# Patient Record
Sex: Female | Born: 1965 | Race: White | Hispanic: No
Health system: Midwestern US, Community
[De-identification: ages and names within clinical notes are randomized; demographics above are authoritative.]

## PROBLEM LIST (undated history)

## (undated) ENCOUNTER — Encounter (HOSPITAL_COMMUNITY): Admission: RE | Payer: Self-pay | Source: Ambulatory Visit

## (undated) SURGERY — ARTHROSCOPY SHOULDER
Anesthesia: General | Site: Shoulder | Laterality: Left

---

## 2004-07-12 ENCOUNTER — Ambulatory Visit (HOSPITAL_COMMUNITY): Payer: Self-pay | Admitting: Family Medicine

## 2010-09-23 LAB — BASIC METABOLIC PANEL
BUN: 25 mg/dL — ABNORMAL HIGH (ref 6–20)
CO2: 30 mmol/L (ref 20–31)
Calcium: 9.7 mg/dL (ref 8.6–10.3)
Chloride: 112 mmol/L — ABNORMAL HIGH (ref 98–110)
Creatinine: 0.63 mg/dL (ref 0.2–1.7)
GFR African American: >60 mL/min (ref >60)
GFR Non-African American: >60 mL/min (ref >60)
Glucose: 115 mg/dL — ABNORMAL HIGH (ref 74–100)
Potassium: 4.2 mmol/L (ref 3.5–5.1)
Sodium: 142 mmol/L (ref 136–146)

## 2010-09-23 LAB — CBC WITH DIFFERENTIAL
Absolute Baso #: 0 10*3/uL (ref 0.0–0.2)
Absolute Eos #: 0 10*3/uL (ref 0.0–0.4)
Absolute Lymph #: 2.5 10*3/uL (ref 1.0–4.8)
Absolute Mono #: 0.7 10*3/uL (ref 0.0–1.0)
Absolute Neut #: 7.8 10*3/uL — ABNORMAL HIGH (ref 1.8–7.7)
Basophils: 0 % (ref 0–2)
Eosinophils %: 0 % (ref 0–8)
Hematocrit: 37.8 % (ref 36–46)
Hemoglobin: 12.9 g/dL (ref 12.0–16.0)
Lymphocytes: 23 % — ABNORMAL LOW (ref 24–44)
MCH: 31.6 pg (ref 26–34)
MCHC: 34.3 g/dL (ref 31–37)
MCV: 92.4 fL (ref 80–100)
Monocytes: 6 % (ref 0–12)
Platelet Count: 238 10*3/uL (ref 140–450)
RBC: 4.09 m/uL (ref 4.0–5.2)
RDW: 13.9 % (ref 10.0–14.0)
Seg Neutrophils: 71 % — ABNORMAL HIGH (ref 36–66)
WBC: 11 10*3/uL (ref 3.5–11.0)

## 2010-09-23 LAB — PROTIME-INR
INR: 0.9 (ref 0.9–1.2)
Prothrombin Time: 10.1 s (ref 9.7–12.2)

## 2010-09-23 LAB — APTT: PTT: 30.3 s (ref 23.2–34.4)

## 2013-07-11 NOTE — Discharge Instructions (Signed)
Back Strain: After Your Visit  Your Care Instructions     Back strain happens when you overstretch, or pull, a muscle in your back. You may hurt your back in an accident or when you exercise or lift something.  Most back pain will get better with rest and time. You can take care of yourself at home to help your back heal.  Follow-up care is a key part of your treatment and safety. Be sure to make and go to all appointments, and call your doctor if you are having problems. It's also a good idea to know your test results and keep a list of the medicines you take.  How can you care for yourself at home?   Try to stay as active as you can, but stop or reduce any activity that causes pain.   Put ice or a cold pack on the sore muscle for 10 to 20 minutes at a time to stop swelling. Try this every 1 to 2 hours for 3 days (when you are awake) or until the swelling goes down. Put a thin cloth between the ice pack and your skin.   After 2 or 3 days, apply a heating pad on low or a warm cloth to your back. Some doctors suggest that you go back and forth between hot and cold treatments.   Take pain medicines exactly as directed.   If the doctor gave you a prescription medicine for pain, take it as prescribed.   If you are not taking a prescription pain medicine, ask your doctor if you can take an over-the-counter medicine.   Try sleeping on your side with a pillow between your legs. Or put a pillow under your knees when you lie on your back. These measures can ease pain in your lower back.   Return to your usual level of activity slowly.  When should you call for help?  Call 911 anytime you think you may need emergency care. For example, call if:   You suddenly cannot walk or stand.   You have sudden numbness or weakness in both legs.  Call your doctor now or seek immediate medical care if:   You have a new loss of bowel or bladder control.   Your pain is worse.   You have new pain, numbness, tingling, or  weakness, especially in the buttocks, genital or rectal area, legs, or feet.   You have symptoms of a urinary infection. For example:   You have blood or pus in your urine.   You have pain in your back just below your rib cage. This is called flank pain.   You have a fever, chills, or body aches.   It hurts to urinate.   You have groin or belly pain.  Watch closely for changes in your health, and be sure to contact your doctor if:   Your pain and swelling do not start to get better after 2 days of home treatment.   Where can you learn more?   Go to [email or web address] and sign in to your MyChart account. Enter U095 in the Search Health Information box to learn more about "Back Strain: After Your Visit."    If you do not have an account, please click on the "Sign Up Now" link.      2006-2015 Healthwise, Incorporated. Care instructions adapted under license by [facility]. This care instruction is for use with your licensed healthcare professional. If you have questions about a medical condition or this   instruction, always ask your healthcare professional. [place] any warranty or liability for your use of this information.  Content Version: 10.4.390249; Current as of: March 18, 2013                Muscle Strain: After Your Visit  Your Care Instructions  A muscle strain happens when you overstretch, or pull, a muscle. It can happen when you exercise or lift something or when you have an accident. Rest and other home care can help the muscle heal.  Follow-up care is a key part of your treatment and safety. Be sure to make and go to all appointments, and call your doctor if you are having problems. It's also a good idea to know your test results and keep a list of the medicines you take.  How can you care for yourself at home?   Rest the strained muscle. Do not put weight on it for a day or two. If your doctor advises you to, use crutches or a sling to rest a  sore limb.   Put ice or a cold pack on the sore muscle for 10 to 20 minutes at a time to stop swelling. Put a thin cloth between the ice pack and your skin.   Prop up the sore arm or leg on a pillow when you ice it or anytime you sit or lie down during the next 3 days. Try to keep it above the level of your heart. This will help reduce swelling.   Take pain medicines exactly as directed.   If the doctor gave you a prescription medicine for pain, take it as prescribed.   If you are not taking a prescription pain medicine, ask your doctor if you can take an over-the-counter medicine.   Do not do anything that makes the pain worse. Return to exercise gradually as you feel better.  When should you call for help?  Call your doctor now or seek immediate medical care if:   You have new severe pain.   Your injured limb is cool or pale or changes color.   You have tingling, weakness, or numbness in your injured limb.   You cannot move the injured area.  Watch closely for changes in your health, and be sure to contact your doctor if:   You cannot put weight on a joint, or it feels unsteady when you walk.   Pain and swelling get worse or do not start to get better after 2 days of home treatment.   Where can you learn more?   Go to [email or web address] and sign in to your MyChart account. Enter [id number] in the [place] box to learn more about "Muscle Strain: After Your Visit."    If you do not have an account, please click on the "Sign Up Now" link.      2006-2015 Healthwise, Incorporated. Care instructions adapted under license by [facility]. This care instruction is for use with your licensed healthcare professional. If you have questions about a medical condition or this instruction, always ask your healthcare professional. [place] any warranty or liability for your use of this information.  Content Version: 10.4.390249; Current as of: March 18, 2013

## 2013-07-11 NOTE — ED Provider Notes (Signed)
Patient is a 48 y.o. female presenting with back pain. The history is provided by the patient.   Back Pain  Location:  Lumbar spine  Quality:  Aching and stiffness  Stiffness is present:  All day  Radiates to:  Does not radiate  Pain severity:  Moderate  Pain is:  Same all the time  Onset quality:  Gradual  Timing:  Intermittent  Progression:  Unchanged  Chronicity:  New  Context: falling and recent injury    Context: not jumping from heights, not MCA, not MVA, not occupational injury and not pedestrian accident    Context comment:  FELL ON ICE  Relieved by:  Being still  Worsened by:  Ambulation, bending, twisting and touching  Ineffective treatments:  NSAIDs  Associated symptoms: no abdominal pain, no abdominal swelling, no bladder incontinence, no bowel incontinence, no chest pain, no dysuria, no fever, no headaches, no leg pain, no numbness, no paresthesias, no pelvic pain, no perianal numbness, no tingling, no weakness and no weight loss    Risk factors comment:  DISC SURGERY 2000      Review of Systems   Constitutional: Negative for fever, chills, weight loss and fatigue.   Respiratory: Negative for cough and shortness of breath.    Cardiovascular: Negative for chest pain.   Gastrointestinal: Negative for nausea, vomiting, abdominal pain and bowel incontinence.   Genitourinary: Negative for bladder incontinence, dysuria, difficulty urinating and pelvic pain.   Musculoskeletal: Positive for back pain. Negative for gait problem.   Skin: Negative for wound.   Neurological: Negative for dizziness, tingling, syncope, weakness, numbness, headaches and paresthesias.       Physical Exam    Procedures    MDM  Number of Diagnoses or Management Options  Sprain of lumbar region:   Urinary tract infection, site not specified:   Diagnosis management comments: [facility] REPORT REVEALS 30 OXYCODONE PRESCRIBED ON 07/07/2013, PATIENT DID NOT REVEAL UNTIL QUESTIONED ABOUT RECENT RX. STATES "NOT WORKING", ADVISED TO FOLLOW UP WITH  PCP AND WILL PRESCRIBE NSAID        Amount and/or Complexity of Data Reviewed  Clinical lab tests: reviewed and ordered  Tests in the radiology section of CPT??: reviewed and ordered    Risk of Complications, Morbidity, and/or Mortality  Presenting problems: low  Diagnostic procedures: low  Management options: low    Patient Progress  Patient progress: stable      Labs  Labs Reviewed   UA W/REFLEX CULTURE - Abnormal; Notable for the following:     Bilirubin Urine SMALL (*)     Ketones, Urine TRACE (*)     Specific Gravity, UA >1.030 (*)     Protein, UA 1+ (*)     Nitrite, Urine POSITIVE (*)     All other components within normal limits   URINE DRUG SCREEN - Abnormal; Notable for the following:     Opiates, Urine POSITIVE (*)     Cannabinoid Scrn, Ur POSITIVE (*)     Oxycodone Screen, Ur POSITIVE (*)     Methamphetamine, Urine POSITIVE (*)     All other components within normal limits   MICROSCOPIC URINALYSIS - Abnormal; Notable for the following:     Bacteria, UA 2+ (*)     Mucus, UA 3+ (*)     All other components within normal limits   URINE CULTURE         Radiology  Xr Lumbar Spine Standard    07/12/2013   FINAL REPORT  EXAM:  XR LUMBAR SPINE STANDARD  HISTORY:  fall/pain   TECHNIQUE:  Three views of the lumbar spine were obtained.  PRIORS:  No prior studies for comparison.  FINDINGS:   Vertebral body height is normal, no acute fracture. Mild spondylitic changes are noted, there is grade 1 anterolisthesis of L4 on L5, and L5 on S1.  Postsurgical changes in the abdomen. Soft tissues are unremarkable.      Impression:   No acute fracture of the lumbar spine.   Anterolisthesis of L4 on L5 and L5 on S1 appear to be chronic changes, if available comparison with old studies may be helpful. If symptoms persist an MRI examination may be performed.   Electronically Signed By: Leighton ParodyJEHANZEB KHAN       EKG Interpretation.      Summation      Patient Course:        ED Medications administered this visit:    Medications    sulfamethoxazole-trimethoprim (BACTRIM DS) 800-160 MG per tablet 1 tablet (not administered)   cyclobenzaprine (FLEXERIL) tablet 10 mg (not administered)       New Prescriptions from this visit:    New Prescriptions    CYCLOBENZAPRINE (FLEXERIL) 10 MG TABLET    Take 1 tablet by mouth 3 times daily as needed for Muscle spasms for up to 3 days.    DICLOFENAC (VOLTAREN) 75 MG EC TABLET    Take 1 tablet by mouth 2 times daily (with meals).    SULFAMETHOXAZOLE-TRIMETHOPRIM (BACTRIM DS) 800-160 MG PER TABLET    Take 1 tablet by mouth 2 times daily for 5 days.       Follow-up:  Lysbeth PennerMarc Anthony Naderer  [address]  [place] [id number]  [phone]    Call in 2 days  SOONER, If symptoms worsen        Final Impression:   1. Sprain of lumbar region    2. Urinary tract infection, site not specified               (Please note that portions of this note were completed with a voice recognition program.  Efforts were made to edit the dictations but occasionally words are mis-transcribed.)      Rosemarie AxBrett W Juanisha Bautch, CNP  07/11/13 2219

## 2013-07-12 ENCOUNTER — Inpatient Hospital Stay: Admit: 2013-07-12 | Discharge: 2013-07-12 | Disposition: A | Discharge status: Home / Self Care

## 2013-07-12 LAB — MICROSCOPIC URINALYSIS
Epithelial Cells UA: 10 /HPF (ref 0–25)
RBC, UA: 0 /HPF (ref 0–2)
WBC, UA: 2 /HPF (ref 0–5)

## 2013-07-12 LAB — URINE DRUG SCREEN
Amphetamine Screen, Ur: NEGATIVE
Barbiturate Screen, Ur: NEGATIVE
Benzodiazepine Screen, Urine: NEGATIVE
Buprenorphine Urine: NEGATIVE
Cannabinoid Scrn, Ur: POSITIVE — AB
Cocaine Metabolite, Urine: NEGATIVE
Methadone Screen, Urine: NEGATIVE
Methamphetamine, Urine: POSITIVE — AB
Opiates, Urine: POSITIVE — AB
Oxycodone Screen, Ur: POSITIVE — AB
Phencyclidine, Urine: NEGATIVE
Propoxyphene, Urine: NEGATIVE
Tricyclic Antidepressants, Urine: NEGATIVE

## 2013-07-12 LAB — UA W/REFLEX CULTURE
Glucose, Ur: NEGATIVE
Leukocyte Esterase, Urine: NEGATIVE
Nitrite, Urine: POSITIVE — AB
Specific Gravity, UA: >1.03 — ABNORMAL HIGH (ref 1.010–1.020)
Urine Hgb: NEGATIVE
Urobilinogen, Urine: NORMAL
pH, UA: 5.5 (ref 5.0–9.0)

## 2013-07-12 MED ORDER — DICLOFENAC SODIUM 75 MG PO TBEC
75 MG | ORAL_TABLET | Freq: Two times a day (BID) | ORAL | Status: DC
Start: 2013-07-12 — End: 2013-11-29

## 2013-07-12 MED ORDER — CYCLOBENZAPRINE HCL 10 MG PO TABS
10 MG | ORAL_TABLET | Freq: Three times a day (TID) | ORAL | Status: AC | PRN
Start: 2013-07-12 — End: 2013-07-14

## 2013-07-12 MED ORDER — SULFAMETHOXAZOLE-TRIMETHOPRIM 800-160 MG PO TABS
800-160 MG | ORAL_TABLET | Freq: Two times a day (BID) | ORAL | Status: AC
Start: 2013-07-12 — End: 2013-07-16

## 2013-07-12 MED ADMIN — sulfamethoxazole-trimethoprim (BACTRIM DS) 800-160 MG per tablet 1 tablet: 1 | ORAL | @ 02:00:00 | NDC 68084023011

## 2013-07-12 MED ADMIN — cyclobenzaprine (FLEXERIL) tablet 10 mg: 10 mg | ORAL | @ 02:00:00 | NDC 68084039711

## 2013-07-12 MED FILL — SULFAMETHOXAZOLE-TMP DS 800-160 MG PO TABS: 800-160 MG | ORAL | Qty: 1

## 2013-07-12 MED FILL — CYCLOBENZAPRINE HCL 10 MG PO TABS: 10 MG | ORAL | Qty: 1

## 2013-07-14 LAB — CULTURE, URINE

## 2013-11-29 ENCOUNTER — Inpatient Hospital Stay: Admit: 2013-11-29 | Discharge: 2013-11-30 | Disposition: A | Discharge status: Home / Self Care

## 2013-11-29 MED ORDER — HYDROCODONE-ACETAMINOPHEN 5-325 MG PO TABS
5-325 MG | ORAL_TABLET | Freq: Four times a day (QID) | ORAL | Status: AC | PRN
Start: 2013-11-29 — End: 2013-12-02

## 2013-11-29 MED ORDER — CLINDAMYCIN HCL 300 MG PO CAPS
300 MG | ORAL_CAPSULE | Freq: Four times a day (QID) | ORAL | Status: AC
Start: 2013-11-29 — End: 2013-12-09

## 2013-11-29 MED FILL — CLEOCIN IN D5W 600 MG/50ML IV SOLN: 600 MG/50ML | INTRAVENOUS | Qty: 50

## 2013-11-29 NOTE — Discharge Instructions (Signed)
Cellulitis: After Your Visit  Your Care Instructions     Cellulitis is a skin infection. It often occurs after a break in the skin from a scrape, cut, bite, or puncture, or after a rash.  The doctor has checked you carefully, but problems can develop later. If you notice any problems or new symptoms, get medical treatment right away.  Follow-up care is a key part of your treatment and safety. Be sure to make and go to all appointments, and call your doctor if you are having problems. It's also a good idea to know your test results and keep a list of the medicines you take.  How can you care for yourself at home?   Take your antibiotics as directed. Do not stop taking them just because you feel better. You need to take the full course of antibiotics.   Prop up the infected area on pillows to reduce pain and swelling. Try to keep the area above the level of your heart as often as you can.   If your doctor told you how to care for your wound, follow your doctor's instructions. If you did not get instructions, follow this general advice:   Wash the wound with clean water 2 times a day. Don't use hydrogen peroxide or alcohol, which can slow healing.   You may cover the wound with a thin layer of petroleum jelly, such as Vaseline, and a nonstick bandage.   Apply more petroleum jelly and replace the bandage as needed.   Be safe with medicines. Take pain medicines exactly as directed.   If the doctor gave you a prescription medicine for pain, take it as prescribed.   If you are not taking a prescription pain medicine, ask your doctor if you can take an over-the-counter medicine.  To prevent cellulitis in the future   Try to prevent cuts, scrapes, or other injuries to your skin. Cellulitis most often occurs where there is a break in the skin.   If you get a scrape, cut, mild burn, or bite, wash the wound with clean water as soon as you can to help avoid infection. Don't use hydrogen peroxide or alcohol, which  can slow healing.   If you have swelling in your legs (edema), support stockings and good skin care may help prevent leg sores and cellulitis.   Take care of your feet, especially if you have diabetes or other conditions that increase the risk of infection. Wear shoes and socks. Do not go barefoot. If you have athlete's foot or other skin problems on your feet, talk to your doctor about how to treat them.  When should you call for help?  Call your doctor now or seek immediate medical care if:   You have signs that your infection is getting worse, such as:   Increased pain, swelling, warmth, or redness.   Red streaks leading from the area.   Pus draining from the area.   A fever.   You get a rash.  Watch closely for changes in your health, and be sure to contact your doctor if:   You are not getting better after 1 day (24 hours).   You do not get better as expected.   Where can you learn more?   Go to [email or web address] and sign in to your MyChart account. Enter X309 in the Search Health Information box to learn more about "Cellulitis: After Your Visit."    If you do not have an account, please click on the "  Sign Up Now" link.      2006-2015 Healthwise, Incorporated. Care instructions adapted under license by [facility]. This care instruction is for use with your licensed healthcare professional. If you have questions about a medical condition or this instruction, always ask your healthcare professional. Healthwise, Incorporated disclaims any warranty or liability for your use of this information.  Content Version: 10.5.422740; Current as of: June 24, 2013

## 2013-11-29 NOTE — ED Provider Notes (Signed)
Patient is a 48 y.o. female presenting with facial injury. The history is provided by the patient.   Facial Injury  Injury mechanism: SWELLING/INSCECT STING.  Location:  Face  Time since incident:  4 days  Pain details:     Quality:  Pressure and aching    Severity:  Moderate    Timing:  Intermittent    Progression:  Waxing and waning  Chronicity:  New  Foreign body present:  No foreign bodies  Relieved by:  None tried  Worsened by:  Nothing tried  Ineffective treatments:  None tried  Associated symptoms: no altered mental status, no congestion, no difficulty breathing, no double vision, no ear pain, no epistaxis, no headaches, no loss of consciousness, no malocclusion, no nausea, no neck pain, no rhinorrhea, no trismus, no vomiting and no wheezing    Risk factors: no concern for non-accidental trauma and no trauma        Review of Systems   HENT: Negative for congestion, ear pain, nosebleeds and rhinorrhea.    Eyes: Negative for double vision.   Respiratory: Negative for wheezing.    Gastrointestinal: Negative for nausea and vomiting.   Musculoskeletal: Negative for neck pain.   Neurological: Negative for loss of consciousness and headaches.       Physical Exam   Constitutional: She is oriented to person, place, and time. She appears well-developed and well-nourished. No distress.   HENT:   Head: Head is without right periorbital erythema and without left periorbital erythema.       Nose: Nose normal.   Mouth/Throat: Mucous membranes are normal. Mucous membranes are not dry. Posterior oropharyngeal erythema (mild) present. No oropharyngeal exudate or posterior oropharyngeal edema.   Eyes: Conjunctivae are normal. No scleral icterus.   Neck: Normal range of motion. Neck supple.   Cardiovascular: Normal rate and regular rhythm.    Pulmonary/Chest: Effort normal and breath sounds normal. No respiratory distress. She has no wheezes.   Abdominal: Soft. Bowel sounds are normal.   Lymphadenopathy:     She has no  cervical adenopathy.   Neurological: She is alert and oriented to person, place, and time.   Skin: Skin is warm and dry.   Nursing note and vitals reviewed.      Procedures    MDM  Number of Diagnoses or Management Options  Facial cellulitis:   Risk of Complications, Morbidity, and/or Mortality  Presenting problems: moderate  Diagnostic procedures: low  Management options: moderate  General comments: Patient is refusing IV antibiotics in ER, she only will agree to oral antibiotic medication. She is advised of risk of worsening infection and difficulty breathing. She is advised to return to ER or call 911 immediately for any further concerns.        Labs      Radiology      EKG Interpretation.      Summation      Patient Course:        ED Medications administered this visit:  Medications - No data to display    New Prescriptions from this visit:    Discharge Medication List as of 11/29/2013  7:47 PM      START taking these medications    Details   clindamycin (CLEOCIN) 300 MG capsule Take 1 capsule by mouth 4 times daily for 10 days, Disp-40 capsule, R-0      HYDROcodone-acetaminophen (NORCO) 5-325 MG per tablet Take 1 tablet by mouth every 6 hours as needed for Pain, Disp-12 tablet, R-0  Follow-up:  Provider of choice    Schedule an appointment as soon as possible for a visit      Return to the ER     In 2 days  Wound check, SOONER, If symptoms worsen        Final Impression:   1. Facial cellulitis               (Please note that portions of this note were completed with a voice recognition program.  Efforts were made to edit the dictations but occasionally words are mis-transcribed.)      [place], CNP  11/29/13 2342

## 2017-06-29 ENCOUNTER — Ambulatory Visit (HOSPITAL_BASED_OUTPATIENT_CLINIC_OR_DEPARTMENT_OTHER): Payer: Self-pay | Admitting: ORTHOPEDIC, SPORTS MEDICINE

## 2017-08-24 ENCOUNTER — Ambulatory Visit (HOSPITAL_BASED_OUTPATIENT_CLINIC_OR_DEPARTMENT_OTHER): Payer: 59 | Admitting: ORTHOPEDIC, SPORTS MEDICINE

## 2017-08-24 ENCOUNTER — Ambulatory Visit
Admission: RE | Admit: 2017-08-24 | Discharge: 2017-08-24 | Disposition: A | Discharge status: Home / Self Care | Payer: 59 | Source: Ambulatory Visit | Attending: ORTHOPEDIC, SPORTS MEDICINE | Admitting: ORTHOPEDIC, SPORTS MEDICINE

## 2017-08-24 ENCOUNTER — Encounter (HOSPITAL_BASED_OUTPATIENT_CLINIC_OR_DEPARTMENT_OTHER): Payer: Self-pay | Admitting: ORTHOPEDIC, SPORTS MEDICINE

## 2017-08-24 VITALS — BP 100/57 | HR 74 | Temp 98.2°F | Ht 64.45 in | Wt 160.3 lb

## 2017-08-24 DIAGNOSIS — M25512 Pain in left shoulder: Secondary | ICD-10-CM

## 2017-08-24 DIAGNOSIS — M75102 Unspecified rotator cuff tear or rupture of left shoulder, not specified as traumatic: Secondary | ICD-10-CM

## 2017-08-24 NOTE — Patient Instructions (Signed)
After rotator cuff repair:  You will be in a brace for 6 weeks after surgery. You will start PT about 4 days after surgery. That can be done at a facility close to home.

## 2017-08-24 NOTE — H&P (Signed)
PATIENT NAMCarren Robertson: Soderlund, [name]   HOSPITAL NUMBER:  [id number]  DATE OF SERVICE: 08/24/2017  DATE OF BIRTH:  [birth year]    HISTORY AND PHYSICAL    HISTORY OF PRESENT ILLNESS:  Natalie Robertson is a 52 year old right-hand dominant female who presented to clinic today for evaluation of her left shoulder pain.  She does not recall a specific injury.  She reports about 5 years ago she fell, but she is not really sure if this is the cause of her shoulder pain.  She has been having pain for about 5 years.  A lot of the pain is located around the anterior lateral side of her shoulder.  She reports it is intermittent.  Her main complaint is it is causing difficulty sleeping.  She rates the pain sometimes up to 7/10.  She will describe it as an ache, but will become stabbing at times.  She denies any numbness or tingling distally.  She denies any radiation of the pain.  She had an injection performed by Dr. Daneil Dolinuel a couple months ago.  She reports initially she thought it made her pain worse, but then she noticed a decrease in her pain.  She has done a home exercise program, but no formalized physical therapy.  She has not had any previous surgeries on the shoulder.    PAST MEDICAL HISTORY:  Negative.    PAST SURGICAL HISTORY:  Left knee ACL reconstruction, cesarean section x2, hammertoe.  No anesthesia complications.    CURRENT MEDICATIONS:  1. Testosterone 50 mg subcutaneously weekly.   2. Estradiol 0.5 mg daily.   3. Progesterone 200 mg daily.    ALLERGIES:  She is allergic to BEE STINGS.    SOCIAL HISTORY:  She is a [employer] teacher.  She denies any use of tobacco.  Alcohol use is occasional.  She is married.    FAMILY HISTORY:  Asthma, COPD, heart attack, acid reflux, and vitamin D deficiency.    REVIEW OF SYSTEMS:  All systems are negative except for the HPI.    PHYSICAL EXAMINATION:  Today, Natalie Robertson is a pleasant female in no acute distress.  She had appropriate mood and affect.  Blood pressure was 100/57, pulse 74,  temperature 36.8 degrees Celsius.  She is 72.2 kg and 163.7 cm.  Well appearing.  The sclerae was nonicteric.  Breathing was not labored.  Abdomen was not distended.  On evaluation of the left upper extremity, forward flexion to 180 degrees, abduction to 90 degrees, external rotation to 50 degrees, internal rotation to L3.  Tender to palpation on the anterior lateral side of her shoulder.  No AC joint tenderness.  No posterior shoulder tenderness.  Rotator cuff strength 5/5 in external rotation with associated pain, 5/5 internal rotation.  Neurovascularly intact distally.  No skin rashes over the upper extremity.    IMAGING:  Radiographs were taken today of the shoulder, which are pretty much negative.  MRI reviewed that was performed at [facility] demonstrates an anterior small full-thickness rotator cuff tear.    ASSESSMENT:  Left shoulder rotator cuff tear.    PLAN:  We spoke with Ms. Maye HidesMcCann concerning treatment options for her left shoulder.  Considering she has been having pain for 5 years and the injection did not help significantly with her symptoms, we would offer her a left shoulder arthroscopy with a rotator cuff repair.  She understands that she will be in a brace for 6 weeks postoperatively.  She is interested in proceeding, but more than likely  would like to do it in June.  So, we will bring her back for history and physical prior to surgery.  She stated an understanding.        Amy Burnard Bunting, PA-C  [facility]    Ernestine Conrad, MD  Assistant Professor   [facility]               CC:   Corey Harold, MD   [address]   [place], [place] [address]     Nanci Pina, MD   [address] [place]   [place]   [address]       DD:  08/24/2017 11:29:48  DT:  08/24/2017 14:18:58 KB  D#:  [id number]  I personally saw and examined the patient. See mid-level's note for additional details. My findings are consistent with Left shoulder pain    Left rotator cuff tear.

## 2017-10-14 ENCOUNTER — Ambulatory Visit (INDEPENDENT_AMBULATORY_CARE_PROVIDER_SITE_OTHER): Payer: 59 | Admitting: Podiatrist

## 2017-10-14 ENCOUNTER — Ambulatory Visit
Admission: RE | Admit: 2017-10-14 | Discharge: 2017-10-14 | Disposition: A | Discharge status: Home / Self Care | Payer: 59 | Source: Ambulatory Visit | Attending: Podiatrist | Admitting: Podiatrist

## 2017-10-14 ENCOUNTER — Encounter (INDEPENDENT_AMBULATORY_CARE_PROVIDER_SITE_OTHER): Payer: Self-pay | Admitting: Podiatrist

## 2017-10-14 VITALS — BP 109/66 | HR 69 | Ht 63.5 in | Wt 156.3 lb

## 2017-10-14 DIAGNOSIS — M79671 Pain in right foot: Secondary | ICD-10-CM

## 2017-10-14 DIAGNOSIS — M722 Plantar fascial fibromatosis: Secondary | ICD-10-CM

## 2017-10-14 DIAGNOSIS — Z981 Arthrodesis status: Secondary | ICD-10-CM | POA: Insufficient documentation

## 2017-10-14 DIAGNOSIS — G8929 Other chronic pain: Secondary | ICD-10-CM

## 2017-10-14 DIAGNOSIS — Z9889 Other specified postprocedural states: Secondary | ICD-10-CM

## 2017-10-14 DIAGNOSIS — R234 Changes in skin texture: Secondary | ICD-10-CM | POA: Insufficient documentation

## 2017-10-14 DIAGNOSIS — Q742 Other congenital malformations of lower limb(s), including pelvic girdle: Secondary | ICD-10-CM

## 2017-10-14 DIAGNOSIS — M79674 Pain in right toe(s): Secondary | ICD-10-CM | POA: Insufficient documentation

## 2017-10-14 DIAGNOSIS — M79675 Pain in left toe(s): Secondary | ICD-10-CM | POA: Insufficient documentation

## 2017-10-14 DIAGNOSIS — M79672 Pain in left foot: Secondary | ICD-10-CM | POA: Insufficient documentation

## 2017-10-14 DIAGNOSIS — Z87891 Personal history of nicotine dependence: Secondary | ICD-10-CM | POA: Insufficient documentation

## 2017-10-14 MED ORDER — UREA 20 % TOPICAL CREAM
TOPICAL_CREAM | Freq: Two times a day (BID) | CUTANEOUS | 3 refills | Status: AC
Start: 2017-10-14 — End: 2018-01-12

## 2017-10-14 NOTE — Procedures (Signed)
[facility]  [address]  [place] [id number]  [phone]  [phone]    Patient Name: Natalie Robertson  MRN: [id number]  Date: 10/14/2017    Orthopaedic Faculty: Victorino SparrowKathryn Jaion Lagrange, DPM    Arch Cookies were applied to the bilateral  LE. Instructions were given on the application and care of the dressing.  Instructions were given on proper skin care, signs and symptoms of skin problems and/or infection.  The patient was instructed to call the Orthopaedic Department with any questions or problems.  Emergency numbers were also given.  The patient verbalized understanding of the above.    Person applying the device: Oscar LaAbigayle Yutzy, MA 10/14/2017, 15:24

## 2017-10-22 NOTE — H&P (Signed)
PODIATRY CLINIC  DEPARTMENT OF ORTHOPAEDICS  [facility]    Physician Office Center  [address]  [place], [place] [address]    PATIENT NAME:  Natalie Robertson  DATE OF BIRTH:  1965/09/12  MRN:  [id number]  DATE OF SERVICE:  10/14/2017    HISTORY OF PRESENT ILLNESS:   52 y.o. White female presents for evaluation of chief complaint of  Right foot pain.  Denies any hx of injury to the area of pain.  States that she was previously diagnosed with right plantar fasciitis by Dr. Odis Luster in [place] but is seeking an additional opinion and possible transfer of care.  Patient states she also has a hx of bilateral heel fissures which she has been treating with lotion, bilateral 2nd digit hammertoe repair done as an arthrodesis and patient feels that the toes are now slightly too long and become painful when they hit the end of her shoe.  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  Patient also works as a Lawyer, is married and lives with her spouse and children.     PAST MEDICAL HISTORY: No past medical history on file.  PAST SURGICAL HISTORY: No past surgical history on file.  MEDICATIONS:   Current Outpatient Medications   Medication Sig   . estradiol (ESTRACE) 0.5 mg Oral Tablet Take 0.5 mg by mouth Once a day   . progesterone micronized (PROMETRIUM) 200 mg Oral Capsule Take 200 mg by mouth Every night   . testosterone enanthate 50 mg/0.5 mL Subcutaneous Auto-Injector 50 mg by Subcutaneous route Every 7 days   . Urea 20 % Cream Apply topically Twice daily for 90 days apply to affected area as directed     ALLERGIES: No Known Allergies  FAMILY MEDICAL HISTORY:   Family Medical History:     None        SOCIAL HISTORY:   Social History     Occupational History   . Not on file   Tobacco Use   . Smoking status: Former Games developer   . Smokeless tobacco: Never Used   Substance and Sexual Activity   . Alcohol use: Not on file   . Drug use: Not on file   . Sexual  activity: Not on file       REVIEW OF SYSTEMS:   Denies fevers, chills, nausea, vomiting, and shortness of breath.  All other systems reviewed and are negative, except as reported in HPI.      PHYSICAL EXAM:   General:  Appears stated age.  Well-appearing.    Psychiatric:  Pleasant.  Cooperative.  Alert and oriented x 3.    Respiratory:  Lung expansion symmetric.  No active work of breathing.    Heme/Lymphatic:  No lymphadenopathy.    ENMT:  Sclera clear.  Trachea midline.  Mucous membranes moist.   Vascular:  +2/4 dorsalis pedis and posterior tibial pulses, bilaterally.  Capillary fill time was less than 3 seconds, bilaterally.  Skin temperature was warm at the level of the toes, bilaterally.  There was no edema noted.   Shaved hair growth noted to bilateral tibia.  Skin was of good turgor and texture.    Neurologic:  Gross sensation intact to touch, bilaterally.  No paresthesias noted, bilateral feet.  No areas of numbness noted.  Dermatologic:    Bilateral plantar heels with very superficial skin fissuring and overlying xerotic appearance.    Mild hypertrophic thickening noted to distal tufts of  bilateral 2nd digits. No open wounds or lesions were noted.  No ecchymosis or erythema noted, bilateral feet.  No areas of hyperpigmentation.  Nails are mildly incurvated however grossly normotrophic in appearance and with no signs of onychocryptosis, bilaterally.  No maceration noted to interspaces.    Musculoskeletal: bilateral 2nd digit s/p arthrodesis at distal interphalangeal joint and with slightly elongated length with respect to adjacent digits, no pain with range of motion or with palpation.  Positive pain with right heel squeeze and with palpation to the anteromedial aspect of the calcaneus, no pain with palpation medial band of the plantar fascia.  +5/5 dorsiflexion, plantarflexion, inversion, eversion, intrinsic muscle strength, bilaterally.  Full range of motion, all quadrants, bilaterally.    No pain with  range of motion.   No  crepitus noted.      RADIOGRAPHS:   1.  right foot, date  10/14/2017, personally reviewed and interpreted by myself:     COMPARISON:  None.    FINDINGS:  Joint alignment is within normal limits. No acute fracture  dislocation. No secondary degenerative change or articular erosions. The  plantar arch is maintained. Soft tissues demonstrate no significant  abnormality.    IMPRESSION:  Unremarkable exam.    ASSESSMENT:   1.  Bilateral foot pain; right greater than left likely 2/2 to plantar fasciitis  2.  Right plantar fasciitis  3. Distal 2nd digit distal tuft pain 2/2 to slightly elongated length and hx of arthrodesis  4. Hx of bilateral heel fissures    PLAN:   - Discussed clinical and radiologic findings and answered all questions.    - Discussed conservative tx for plantar fasciitis including icing, stretching, supportive footwear, arch supports, heel lifts and cortisone injection. Information sheet given.  - Patient advised to start daily BID, at minimum, conservative at home care.  - Demonstrated various stretching exercises  - Discussed features of supportive footwear and OTC inserts such as Superfeet, Power Step and Go Comfort  - Would like to proceed with cortisone injection. Risks, benefits and alternatives discussed.  All questions answered. Advised that only offer cortisone injection to the same area every 4 months and would discontinue after a total of 4 injections.  -Bilateral arch cookies made  - Prescription for 20% Carmol cream given to be applied to all areas of dry skin  - RTC prn      Procedure:  A steroid injection was performed on the  right heel using 1mL of 1% Lidocaine plain, 1mL of 0.5% marcaine plain and 1mL of 40 mg/mL of Depo-medrol. The area was then dressed with a band-aid. Patient tolerated the procedure well and related improvement in pain immediately after injection.      Samara Deist//Geneva Barrero Herschel SenegalB[place]ia, DPM  Podiatry, Assistant Professor  Department of Orthopaedics  [facility]  [employer]  Pager [phone]    Victorino SparrowKathryn Chanc Kervin, North DakotaDPM  10/22/2017, 14:34

## 2017-10-22 NOTE — Procedures (Signed)
See H& P.

## 2018-07-12 ENCOUNTER — Other Ambulatory Visit (FREE_STANDING_LABORATORY_FACILITY): Payer: Self-pay | Admitting: OBSTETRICS/GYNECOLOGY

## 2018-07-12 ENCOUNTER — Encounter (FREE_STANDING_LABORATORY_FACILITY)
Admit: 2018-07-12 | Discharge: 2018-07-12 | Disposition: A | Discharge status: Home / Self Care | Payer: Self-pay | Attending: OBSTETRICS/GYNECOLOGY | Admitting: OBSTETRICS/GYNECOLOGY

## 2018-07-12 LAB — ESTRADIOL: ESTRADIOL: 22 pg/mL

## 2018-07-12 LAB — PROGESTERONE: PROGESTERONE: 0.6 ng/mL

## 2018-07-13 LAB — LIGHT GREEN TOP TUBE

## 2019-12-15 ENCOUNTER — Other Ambulatory Visit (FREE_STANDING_LABORATORY_FACILITY): Payer: Self-pay | Admitting: OBSTETRICS/GYNECOLOGY

## 2019-12-15 ENCOUNTER — Encounter (FREE_STANDING_LABORATORY_FACILITY)
Admit: 2019-12-15 | Discharge: 2019-12-15 | Disposition: A | Discharge status: Home / Self Care | Payer: Self-pay | Attending: OBSTETRICS/GYNECOLOGY | Admitting: OBSTETRICS/GYNECOLOGY

## 2019-12-15 LAB — ESTRADIOL: ESTRADIOL: <24 pg/mL

## 2019-12-15 LAB — PROGESTERONE: PROGESTERONE: <0.5 ng/mL

## 2020-03-09 ENCOUNTER — Encounter (FREE_STANDING_LABORATORY_FACILITY): Admit: 2020-03-09 | Discharge: 2020-03-09 | Disposition: A | Discharge status: Home / Self Care | Payer: Self-pay

## 2020-03-09 ENCOUNTER — Other Ambulatory Visit (FREE_STANDING_LABORATORY_FACILITY): Payer: Self-pay | Admitting: Surgery

## 2020-03-09 DIAGNOSIS — Z01810 Encounter for preprocedural cardiovascular examination: Secondary | ICD-10-CM

## 2020-03-09 DIAGNOSIS — R001 Bradycardia, unspecified: Secondary | ICD-10-CM

## 2020-03-10 LAB — COVID-19 ~~LOC~~ MOLECULAR LAB TESTING: SARS-CoV-2: NOT DETECTED

## 2020-03-12 ENCOUNTER — Encounter (FREE_STANDING_LABORATORY_FACILITY): Admit: 2020-03-12 | Discharge: 2020-03-12 | Disposition: A | Discharge status: Home / Self Care | Payer: Self-pay

## 2020-03-12 ENCOUNTER — Other Ambulatory Visit (HOSPITAL_BASED_OUTPATIENT_CLINIC_OR_DEPARTMENT_OTHER): Payer: 59 | Admitting: Surgery

## 2020-03-12 DIAGNOSIS — K8010 Calculus of gallbladder with chronic cholecystitis without obstruction: Secondary | ICD-10-CM

## 2020-03-14 LAB — SURGICAL PATHOLOGY SPECIMEN

## 2020-03-22 ENCOUNTER — Ambulatory Visit
Admission: RE | Admit: 2020-03-22 | Discharge: 2020-03-22 | Disposition: A | Discharge status: Home / Self Care | Payer: 59 | Source: Ambulatory Visit | Attending: ORTHOPEDIC, SPORTS MEDICINE | Admitting: ORTHOPEDIC, SPORTS MEDICINE

## 2020-03-22 ENCOUNTER — Ambulatory Visit (HOSPITAL_BASED_OUTPATIENT_CLINIC_OR_DEPARTMENT_OTHER): Payer: 59 | Admitting: ORTHOPEDIC, SPORTS MEDICINE

## 2020-03-22 ENCOUNTER — Other Ambulatory Visit: Payer: Self-pay

## 2020-03-22 ENCOUNTER — Encounter (HOSPITAL_BASED_OUTPATIENT_CLINIC_OR_DEPARTMENT_OTHER): Payer: Self-pay | Admitting: ORTHOPEDIC, SPORTS MEDICINE

## 2020-03-22 VITALS — BP 110/45 | HR 82 | Temp 96.4°F

## 2020-03-22 DIAGNOSIS — M25512 Pain in left shoulder: Secondary | ICD-10-CM

## 2020-04-04 NOTE — Progress Notes (Signed)
PATIENT NAMESEDONIA, Robertson  HOSPITAL NUMBER: [id number]  DATE OF SERVICE: 03/22/2020  DATE OF BIRTH:  06/28/1965    CLINIC NOTE    SUBJECTIVE:  Natalie Robertson is a 54 year old right-hand-dominant female who presents to clinic today for follow-up of her left shoulder pain.  She was most recently seen by [place] in 2019 at which point she was diagnosed with a left rotator cuff tear and recommended surgery.  She did not end up undergoing surgery at a time in part due to COVID and difficulty finding a time to undergo the surgery.  She states the pain has somewhat improved over the past 2 years; however, she still does have pain on a daily basis and does affect her in day to day life.  She states she is still interested in surgery, and returns to clinic today to discuss surgical management of her left shoulder pain.  She has tried a home exercise program in the past with little improvement.  Additionally, she has undergone an injection in the shoulder.  She denies any other symptoms at today's visit    OBJECTIVE:  Vitals were taken at today's visit in displayed a BP:  110/45; Pulse:  82; Temp:  35.8.  On general exam, the patient appears well.  The patient appears appropriate for stated age.  The patient does not appear to be in acute distress.  Mood and affect are congruent with current clinical situation.  Breathing is nonlabored.  The patient does not appear to be using accessory muscles to breathe. Pulses are palpable at the radial artery.  On examination of the shoulder, mild tenderness to palpation over the left shoulder globally.  Range of motion displayed forward flexion to 180, abduction to 110, internal rotation to L2, and external rotation to 65. 4+/5 strength with external rotation and /5 strength with internal rotation.  Pain with empty can test. Negative Speeds test.  Negative belly press test.     IMAGING:  Radiographs were taken at today's clinic and display no significant degenerative change.  No acute fracture or  dislocation noted.    ASSESSMENT AND PLAN:  At today's visit, we thoroughly discussed with the patient her left shoulder pain.  She did have an MRI reviewed in 2018 which showed a rotator cuff tear.  At that time, we planned to undergo surgery; however, her surgery was delayed secondary to COVID.  She has had continued shoulder pain.  She has failed home exercise and injections in the left shoulder.  At this point, she would like to undergo operative management of the left shoulder.  We believe this is reasonable considering she has failed conservative management and is experiencing continued pain.  We will plan follow-up after preoperative H&P.    Mechele Claude, Natalie Robertson    Natalie Conrad, Natalie Robertson  Chief, Sports Medicine Service; Associate Professor   [facility] Department of Orthopaedics        I saw and examined the patient.  I reviewed the resident's note.  I agree with the findings and plan of care as documented in the resident's note.  Any exceptions/additions are edited/noted.    Natalie Conrad, Natalie Robertson

## 2020-12-18 ENCOUNTER — Telehealth (HOSPITAL_BASED_OUTPATIENT_CLINIC_OR_DEPARTMENT_OTHER): Payer: Self-pay | Admitting: ORTHOPEDIC, SPORTS MEDICINE

## 2020-12-18 NOTE — Telephone Encounter (Signed)
12/13/2020 no surgery needed, per pt - not interested in scheduling.

## 2021-12-10 ENCOUNTER — Other Ambulatory Visit: Payer: 59 | Attending: OBSTETRICS/GYNECOLOGY

## 2021-12-10 ENCOUNTER — Other Ambulatory Visit: Payer: Self-pay

## 2021-12-10 DIAGNOSIS — R6882 Decreased libido: Secondary | ICD-10-CM | POA: Insufficient documentation

## 2021-12-10 DIAGNOSIS — F520 Hypoactive sexual desire disorder: Secondary | ICD-10-CM | POA: Insufficient documentation

## 2021-12-10 DIAGNOSIS — E349 Endocrine disorder, unspecified: Secondary | ICD-10-CM | POA: Insufficient documentation

## 2021-12-10 DIAGNOSIS — N951 Menopausal and female climacteric states: Secondary | ICD-10-CM | POA: Insufficient documentation

## 2021-12-10 LAB — ESTRADIOL: ESTRADIOL: <24 pg/mL

## 2021-12-10 LAB — PROGESTERONE: PROGESTERONE: <0.5 ng/mL

## 2021-12-14 LAB — TESTOSTERONE FREE (DIALYSIS) AND TOTAL,MS
TESTOSTERONE, FREE: 2.9 pg/mL (ref 0.1–6.4)
TESTOSTERONE,TOTAL,LC/MS/MS: 32 ng/dL (ref 2–45)

## 2024-05-04 ENCOUNTER — Ambulatory Visit (HOSPITAL_COMMUNITY): Admission: RE | Admit: 2024-05-04 | Payer: 59 | Source: Ambulatory Visit | Admitting: ORTHOPEDIC, SPORTS MEDICINE
